# Patient Record
Sex: Male | Born: 1990 | Race: Black or African American | Hispanic: No | Marital: Single | Smoking: Never smoker
Health system: Southern US, Community
[De-identification: ages and names within clinical notes are randomized; demographics above are authoritative.]

---
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
## 2011-11-13 ENCOUNTER — Encounter: Payer: Self-pay | Admitting: *Deleted

## 2011-11-21 ENCOUNTER — Ambulatory Visit: Payer: BLUE CROSS/BLUE SHIELD | Admitting: Cardiology

## 2012-12-07 ENCOUNTER — Encounter (HOSPITAL_COMMUNITY): Payer: Self-pay | Admitting: Emergency Medicine

## 2012-12-07 ENCOUNTER — Emergency Department (HOSPITAL_COMMUNITY)
Admission: EM | Admit: 2012-12-07 | Discharge: 2012-12-07 | Disposition: A | Discharge status: Home / Self Care | Payer: BC Managed Care – PPO | Attending: Emergency Medicine | Admitting: Emergency Medicine

## 2012-12-07 DIAGNOSIS — R1310 Dysphagia, unspecified: Secondary | ICD-10-CM | POA: Insufficient documentation

## 2012-12-07 DIAGNOSIS — R05 Cough: Secondary | ICD-10-CM | POA: Insufficient documentation

## 2012-12-07 DIAGNOSIS — R599 Enlarged lymph nodes, unspecified: Secondary | ICD-10-CM | POA: Insufficient documentation

## 2012-12-07 DIAGNOSIS — J029 Acute pharyngitis, unspecified: Secondary | ICD-10-CM

## 2012-12-07 DIAGNOSIS — R5381 Other malaise: Secondary | ICD-10-CM | POA: Insufficient documentation

## 2012-12-07 DIAGNOSIS — Z880 Allergy status to penicillin: Secondary | ICD-10-CM | POA: Insufficient documentation

## 2012-12-07 DIAGNOSIS — R059 Cough, unspecified: Secondary | ICD-10-CM | POA: Insufficient documentation

## 2012-12-07 MED ORDER — LIDOCAINE VISCOUS 2 % MT SOLN
15.0000 mL | Freq: Once | OROMUCOSAL | Status: AC
  Administered 2012-12-07: 15 mL via OROMUCOSAL
  Filled 2012-12-07: qty 15

## 2012-12-07 MED ORDER — DEXAMETHASONE SODIUM PHOSPHATE 10 MG/ML IJ SOLN
10.0000 mg | Freq: Once | INTRAMUSCULAR | Status: AC
  Administered 2012-12-07: 10 mg via INTRAMUSCULAR
  Filled 2012-12-07: qty 1

## 2012-12-07 MED ORDER — MAGIC MOUTHWASH W/LIDOCAINE: 5.0000 mL | Freq: Three times a day (TID) | ORAL | Status: DC | PRN

## 2012-12-07 MED ORDER — IBUPROFEN 400 MG PO TABS
600.0000 mg | ORAL_TABLET | Freq: Once | ORAL | Status: AC
  Administered 2012-12-07: 600 mg via ORAL
  Filled 2012-12-07 (×2): qty 1

## 2012-12-07 MED ORDER — IBUPROFEN 600 MG PO TABS: 600.0000 mg | ORAL_TABLET | Freq: Four times a day (QID) | ORAL | Status: DC | PRN

## 2012-12-07 NOTE — ED Provider Notes (Signed)
CSN: [id number]     Arrival date & time 12/07/12  0242 History   First MD Initiated Contact with Patient 12/07/12 0533     Chief Complaint  Patient presents with  . Sore Throat   (Consider location/radiation/quality/duration/timing/severity/associated sxs/prior Treatment) HPI Comments: Cough x 3 days, with sore throat. No fevers. + dysphagia, and reduced po intake. No malaise.  Patient is a 22 y.o. male presenting with pharyngitis. The history is provided by the patient.  Sore Throat This is a new problem. The current episode started more than 2 days ago. The problem occurs constantly. The problem has not changed since onset.Pertinent negatives include no chest pain. The symptoms are aggravated by drinking, eating and swallowing. Nothing relieves the symptoms. He has tried nothing for the symptoms.    History reviewed. No pertinent past medical history. No past surgical history on file. No family history on file. History  Substance Use Topics  . Smoking status: Never Smoker   . Smokeless tobacco: Not on file  . Alcohol Use: No    Review of Systems  Constitutional: Positive for activity change and fatigue. Negative for fever and chills.  HENT: Positive for trouble swallowing. Negative for drooling.   Respiratory: Positive for cough.   Cardiovascular: Negative for chest pain.  Gastrointestinal: Negative for nausea.  Skin: Negative for rash.    Allergies  Penicillins  Home Medications  No current outpatient prescriptions on file. BP 143/86  Pulse 101  Temp(Src) 98.1 F (36.7 C) (Oral)  Resp 18  SpO2 97% Physical Exam  Nursing note and vitals reviewed. Constitutional: He is oriented to person, place, and time. He appears well-developed.  HENT:  Head: Normocephalic and atraumatic.  Eyes: Conjunctivae and EOM are normal. Pupils are equal, round, and reactive to light.  Neck: Normal range of motion. Neck supple. No thyromegaly present.  No exudates, no trismus, no PTA   Cardiovascular: Normal rate and regular rhythm.   Pulmonary/Chest: Effort normal and breath sounds normal.  Abdominal: Soft. Bowel sounds are normal. He exhibits no distension. There is no tenderness. There is no rebound and no guarding.  Lymphadenopathy:    He has cervical adenopathy.  Neurological: He is alert and oriented to person, place, and time.  Skin: Skin is warm.    ED Course  Procedures (including critical care time) Labs Review Labs Reviewed  RAPID STREP SCREEN  CULTURE, GROUP A STREP   Imaging Review No results found.  EKG Interpretation   None       MDM  No diagnosis found.  Pt with pharyngitis. + cough, no fevers, no exudates. Rapid strep is neg. No concerns for deep infection. Pt has reduced po intake, will give decadron, and ibuprofen.  Derwood Kaplan, MD 12/07/12 [phone]

## 2012-12-07 NOTE — ED Notes (Signed)
Pt c/o sore throat. Denies fever. Painful to swallow.

## 2012-12-09 LAB — CULTURE, GROUP A STREP

## 2015-05-27 ENCOUNTER — Emergency Department (HOSPITAL_COMMUNITY)
Admission: EM | Admit: 2015-05-27 | Discharge: 2015-05-28 | Disposition: A | Discharge status: Home / Self Care | Payer: BLUE CROSS/BLUE SHIELD | Attending: Emergency Medicine | Admitting: Emergency Medicine

## 2015-05-27 ENCOUNTER — Encounter (HOSPITAL_COMMUNITY): Payer: Self-pay | Admitting: Emergency Medicine

## 2015-05-27 DIAGNOSIS — R42 Dizziness and giddiness: Secondary | ICD-10-CM | POA: Insufficient documentation

## 2015-05-27 DIAGNOSIS — R1084 Generalized abdominal pain: Secondary | ICD-10-CM | POA: Diagnosis not present

## 2015-05-27 DIAGNOSIS — R112 Nausea with vomiting, unspecified: Secondary | ICD-10-CM | POA: Diagnosis not present

## 2015-05-27 DIAGNOSIS — Z880 Allergy status to penicillin: Secondary | ICD-10-CM | POA: Diagnosis not present

## 2015-05-27 DIAGNOSIS — R109 Unspecified abdominal pain: Secondary | ICD-10-CM

## 2015-05-27 DIAGNOSIS — R197 Diarrhea, unspecified: Secondary | ICD-10-CM | POA: Insufficient documentation

## 2015-05-27 LAB — COMPREHENSIVE METABOLIC PANEL
ALBUMIN: 4.3 g/dL (ref 3.5–5.0)
ALK PHOS: 64 U/L (ref 38–126)
ALT: 27 U/L (ref 17–63)
AST: 26 U/L (ref 15–41)
Anion gap: 10 (ref 5–15)
BILIRUBIN TOTAL: 0.8 mg/dL (ref 0.3–1.2)
BUN: 16 mg/dL (ref 6–20)
CALCIUM: 9.2 mg/dL (ref 8.9–10.3)
CO2: 24 mmol/L (ref 22–32)
CREATININE: 1.19 mg/dL (ref 0.61–1.24)
Chloride: 104 mmol/L (ref 101–111)
GFR calc Af Amer: >60 mL/min (ref >60)
GLUCOSE: 116 mg/dL — AB (ref 65–99)
POTASSIUM: 4.3 mmol/L (ref 3.5–5.1)
Sodium: 138 mmol/L (ref 135–145)
TOTAL PROTEIN: 7 g/dL (ref 6.5–8.1)

## 2015-05-27 LAB — URINALYSIS, ROUTINE W REFLEX MICROSCOPIC
BILIRUBIN URINE: NEGATIVE
Glucose, UA: NEGATIVE mg/dL
Hgb urine dipstick: NEGATIVE
KETONES UR: NEGATIVE mg/dL
LEUKOCYTES UA: NEGATIVE
NITRITE: NEGATIVE
PH: 6 (ref 5.0–8.0)
PROTEIN: NEGATIVE mg/dL
Specific Gravity, Urine: 1.028 (ref 1.005–1.030)

## 2015-05-27 LAB — CBC
HCT: 49.9 % (ref 39.0–52.0)
Hemoglobin: 16.5 g/dL (ref 13.0–17.0)
MCH: 28.4 pg (ref 26.0–34.0)
MCHC: 33.1 g/dL (ref 30.0–36.0)
MCV: 86 fL (ref 78.0–100.0)
PLATELETS: 241 10*3/uL (ref 150–400)
RBC: 5.8 MIL/uL (ref 4.22–5.81)
RDW: 13.2 % (ref 11.5–15.5)
WBC: 9.9 10*3/uL (ref 4.0–10.5)

## 2015-05-27 LAB — LIPASE, BLOOD: Lipase: 49 U/L (ref 11–51)

## 2015-05-27 NOTE — ED Provider Notes (Signed)
CSN: [id number]     Arrival date & time 05/27/15  2104 History  By signing my name below, I, Arianna Nassar, attest that this documentation has been prepared under the direction and in the presence of Azalia Bilis, MD. Electronically Signed: Octavia Heir, ED Scribe. 05/28/2015. 2:20 AM.    Chief Complaint  Patient presents with  . Abdominal Pain      The history is provided by the patient. No language interpreter was used.   HPI Comments: Carlos Flynn is a 25 y.o. male who presents to the Emergency Department complaining of constant, gradual worsening, moderate, generalized, abdominal pain onset a couple of days ago with associated dizziness, nausea, vomiting, and diarrhea that started today. Denies urinary symptoms, fever, or chills.  History reviewed. No pertinent past medical history. History reviewed. No pertinent past surgical history. No family history on file. Social History  Substance Use Topics  . Smoking status: Never Smoker   . Smokeless tobacco: None  . Alcohol Use: No    Review of Systems  A complete 10 system review of systems was obtained and all systems are negative except as noted in the HPI and PMH.    Allergies  Penicillins  Home Medications   Prior to Admission medications   Medication Sig Start Date End Date Taking? Authorizing Provider  Alum & Mag Hydroxide-Simeth (MAGIC MOUTHWASH W/LIDOCAINE) SOLN Take 5 mLs by mouth 3 (three) times daily as needed. 12/07/12   Derwood Kaplan, MD  ibuprofen (ADVIL,MOTRIN) 600 MG tablet Take 1 tablet (600 mg total) by mouth every 6 (six) hours as needed for pain. 12/07/12   Derwood Kaplan, MD   Triage vitals: BP 139/87 mmHg  Pulse 107  Temp(Src) 100.9 F (38.3 C) (Oral)  Resp 20  Ht  (1.803 m)  Wt 230 lb (104.327 kg)  BMI 32.09 kg/m2  SpO2 100% Physical Exam  Constitutional: He is oriented to person, place, and time. He appears well-developed and well-nourished.  HENT:  Head: Normocephalic.  Eyes: EOM are  normal.  Neck: Normal range of motion.  Pulmonary/Chest: Effort normal.  Abdominal: He exhibits no distension.  Mild generalized abdominal tenderness  Musculoskeletal: Normal range of motion.  Neurological: He is alert and oriented to person, place, and time.  Psychiatric: He has a normal mood and affect.  Nursing note and vitals reviewed.   ED Course  Procedures  DIAGNOSTIC STUDIES: Oxygen Saturation is 100% on RA, normal by my interpretation.  COORDINATION OF CARE:  2:12 AM Will order CT of abdomen. Discussed treatment plan with pt at bedside and pt agreed to plan.  Labs Review Labs Reviewed  COMPREHENSIVE METABOLIC PANEL - Abnormal; Notable for the following:    Glucose, Bld 116 (*)    All other components within normal limits  LIPASE, BLOOD  CBC  URINALYSIS, ROUTINE W REFLEX MICROSCOPIC (NOT AT [facility])    Imaging Review Ct Abdomen Pelvis W Contrast  05/28/2015  CLINICAL DATA:  Acute onset of generalized abdominal pain, fever, nausea, vomiting and diarrhea. Initial encounter. EXAM: CT ABDOMEN AND PELVIS WITH CONTRAST TECHNIQUE: Multidetector CT imaging of the abdomen and pelvis was performed using the standard protocol following bolus administration of intravenous contrast. CONTRAST:  OMNIPAQUE IOHEXOL 300 MG/ML  SOLN COMPARISON:  None. FINDINGS: The visualized lung bases are clear. The liver and spleen are unremarkable in appearance. The gallbladder is within normal limits. The pancreas and adrenal glands are unremarkable. The kidneys are unremarkable in appearance. There is no evidence of hydronephrosis. No renal  or ureteral stones are seen. No perinephric stranding is appreciated. No free fluid is identified. The small bowel is unremarkable in appearance. The stomach is within normal limits. No acute vascular abnormalities are seen. The appendix is normal in caliber, without evidence of appendicitis. The colon is unremarkable in appearance. The bladder is mildly distended and  grossly unremarkable. The prostate remains normal in size. No inguinal lymphadenopathy is seen. No acute osseous abnormalities are identified. IMPRESSION: No acute abnormality seen within the abdomen or pelvis. Electronically Signed   By: Roanna RaiderJeffery  Chang M.D.   On: 05/28/2015 03:51   I have personally reviewed and evaluated these images and lab results as part of my medical decision-making.   EKG Interpretation None      MDM   Final diagnoses:  Nausea vomiting and diarrhea  Abdominal pain, unspecified abdominal location    4:22 AM Patient feels much better this time.  Discharge home in good condition.  Likely viral gastritis.  CT scan without acute pathology.  Home with Zofran.  He understands to return to the ER for new or worsening symptoms   I personally performed the services described in this documentation, which was scribed in my presence. The recorded information has been reviewed and is accurate.      Azalia BilisKevin Alixandra Alfieri, MD 05/28/15 [phone]

## 2015-05-27 NOTE — ED Notes (Signed)
Pt. reports mid abdominal pain with emesis and diarrhea onset this week with mild headache.

## 2015-05-28 ENCOUNTER — Emergency Department (HOSPITAL_COMMUNITY): Payer: BLUE CROSS/BLUE SHIELD

## 2015-05-28 MED ORDER — ONDANSETRON HCL 4 MG/2ML IJ SOLN
4.0000 mg | Freq: Once | INTRAMUSCULAR | Status: AC
  Administered 2015-05-28: 4 mg via INTRAVENOUS
  Filled 2015-05-28: qty 2

## 2015-05-28 MED ORDER — IOHEXOL 300 MG/ML  SOLN
100.0000 mL | Freq: Once | INTRAMUSCULAR | Status: AC | PRN
  Administered 2015-05-28: 100 mL via INTRAVENOUS

## 2015-05-28 MED ORDER — ONDANSETRON 8 MG PO TBDP: 8.0000 mg | ORAL_TABLET | Freq: Three times a day (TID) | ORAL | Status: AC | PRN

## 2015-05-28 MED ORDER — SODIUM CHLORIDE 0.9 % IV BOLUS (SEPSIS)
1000.0000 mL | Freq: Once | INTRAVENOUS | Status: AC
  Administered 2015-05-28: 1000 mL via INTRAVENOUS

## 2015-05-28 MED ORDER — MORPHINE SULFATE (PF) 4 MG/ML IV SOLN
6.0000 mg | Freq: Once | INTRAVENOUS | Status: AC
  Administered 2015-05-28: 6 mg via INTRAVENOUS
  Filled 2015-05-28: qty 2

## 2015-10-04 ENCOUNTER — Emergency Department (HOSPITAL_COMMUNITY)
Admission: EM | Admit: 2015-10-04 | Discharge: 2015-10-04 | Disposition: A | Discharge status: Home / Self Care | Payer: BLUE CROSS/BLUE SHIELD | Attending: Emergency Medicine | Admitting: Emergency Medicine

## 2015-10-04 ENCOUNTER — Encounter (HOSPITAL_COMMUNITY): Payer: Self-pay | Admitting: Emergency Medicine

## 2015-10-04 ENCOUNTER — Emergency Department (HOSPITAL_COMMUNITY): Payer: BLUE CROSS/BLUE SHIELD

## 2015-10-04 DIAGNOSIS — Z79899 Other long term (current) drug therapy: Secondary | ICD-10-CM | POA: Diagnosis not present

## 2015-10-04 DIAGNOSIS — S99912A Unspecified injury of left ankle, initial encounter: Secondary | ICD-10-CM | POA: Diagnosis present

## 2015-10-04 DIAGNOSIS — S93402A Sprain of unspecified ligament of left ankle, initial encounter: Secondary | ICD-10-CM | POA: Insufficient documentation

## 2015-10-04 DIAGNOSIS — Y999 Unspecified external cause status: Secondary | ICD-10-CM | POA: Diagnosis not present

## 2015-10-04 DIAGNOSIS — X501XXA Overexertion from prolonged static or awkward postures, initial encounter: Secondary | ICD-10-CM | POA: Insufficient documentation

## 2015-10-04 DIAGNOSIS — Y929 Unspecified place or not applicable: Secondary | ICD-10-CM | POA: Diagnosis not present

## 2015-10-04 DIAGNOSIS — Y9351 Activity, roller skating (inline) and skateboarding: Secondary | ICD-10-CM | POA: Diagnosis not present

## 2015-10-04 MED ORDER — HYDROCODONE-ACETAMINOPHEN 5-325 MG PO TABS: 1.0000 | ORAL_TABLET | Freq: Four times a day (QID) | ORAL | 0 refills | Status: AC | PRN

## 2015-10-04 MED ORDER — IBUPROFEN 800 MG PO TABS
800.0000 mg | ORAL_TABLET | Freq: Once | ORAL | Status: AC
  Administered 2015-10-04: 800 mg via ORAL
  Filled 2015-10-04: qty 1

## 2015-10-04 MED ORDER — IBUPROFEN 800 MG PO TABS
800.0000 mg | ORAL_TABLET | Freq: Three times a day (TID) | ORAL | 0 refills | Status: AC | PRN
Start: 2015-10-04 — End: ?

## 2015-10-04 NOTE — ED Provider Notes (Signed)
[facility]-EMERGENCY DEPT Provider Note   CSN: [id number] Arrival date & time: 10/04/15  1955  First Provider Contact:   First MD Initiated Contact with Patient 10/04/15 2055     By signing my name below, I, Carlos Flynn, attest that this documentation has been prepared under the direction and in the presence of Buel Ream, PA-C.  Electronically Signed: Placido Flynn, ED Scribe. 10/04/15. 9:00 PM.   History   Chief Complaint Chief Complaint  Patient presents with  . Ankle Pain    HPI HPI Comments: Carlos Flynn is a 25 y.o. male who presents to the Emergency Department complaining of sudden onset, moderate, constant, left ankle pain onset PTA. Pt states he was roller skating and accidentally everted his left ankle resulting in his pain. He states that he took "a pain pill" from a friend and is unsure of what he took. His pain worsens with palpation of the ankle and denies having ambulated since the accident. He reports associated, mild, left ankle swelling. He denies numbness, tingling or any other associated symptoms at this time.   The history is provided by the patient. No language interpreter was used.   History reviewed. No pertinent past medical history.  There are no active problems to display for this patient.   History reviewed. No pertinent surgical history.   Home Medications    Prior to Admission medications   Medication Sig Start Date End Date Taking? Authorizing Provider  HYDROcodone-acetaminophen (NORCO/VICODIN) 5-325 MG tablet Take 1-2 tablets by mouth every 6 (six) hours as needed. 10/04/15   Emi Holes, PA-C  ibuprofen (ADVIL,MOTRIN) 800 MG tablet Take 1 tablet (800 mg total) by mouth every 8 (eight) hours as needed. 10/04/15   Waylan Boga Lew Prout, PA-C  ondansetron (ZOFRAN ODT) 8 MG disintegrating tablet Take 1 tablet (8 mg total) by mouth every 8 (eight) hours as needed for nausea or vomiting. 05/28/15   Azalia Bilis, MD    Family History No family history on  file.  Social History Social History  Substance Use Topics  . Smoking status: Never Smoker  . Smokeless tobacco: Never Used  . Alcohol use Yes     Allergies   Penicillins and Sulfa antibiotics   Review of Systems Review of Systems  Constitutional: Negative for chills and fever.  HENT: Negative for facial swelling and sore throat.   Respiratory: Negative for shortness of breath.   Cardiovascular: Negative for chest pain.  Gastrointestinal: Negative for abdominal pain, nausea and vomiting.  Genitourinary: Negative for dysuria.  Musculoskeletal: Positive for arthralgias and joint swelling.  Skin: Negative for color change, rash and wound.  Psychiatric/Behavioral: The patient is not nervous/anxious.    Physical Exam Updated Vital Signs BP 119/65 (BP Location: Left Arm)   Pulse 104   Temp 98.3 F (36.8 C) (Oral)   Resp 21   SpO2 100%   Physical Exam  Constitutional: He appears well-developed and well-nourished. No distress.  HENT:  Head: Normocephalic and atraumatic.  Eyes: Conjunctivae are normal. Pupils are equal, round, and reactive to light. Right eye exhibits no discharge. Left eye exhibits no discharge. No scleral icterus.  Neck: Normal range of motion. Neck supple. No thyromegaly present.  Cardiovascular: Normal rate, regular rhythm and normal heart sounds.  Exam reveals no gallop and no friction rub.   No murmur heard. Pulmonary/Chest: Effort normal and breath sounds normal. No stridor. No respiratory distress. He has no wheezes. He has no rales.  Abdominal: Soft. Bowel sounds are normal. He exhibits  no distension. There is no tenderness. There is no rebound and no guarding.  Musculoskeletal: He exhibits tenderness. He exhibits no edema.       Left ankle: He exhibits decreased range of motion and swelling. He exhibits no laceration and normal pulse. Tenderness. Medial malleolus tenderness found. No head of 5th metatarsal and no proximal fibula tenderness found.        Right foot: There is no tenderness and no bony tenderness.       Feet:  TTP to medial aspect of the left ankle; 5/5 strength with hip flexion and extension; patient unable to plantar and dorsiflex without pain; able to move all toes freely  Lymphadenopathy:    He has no cervical adenopathy.  Neurological: He is alert. No sensory deficit. Coordination normal.  Nml sensation  Skin: Skin is warm and dry. No rash noted. He is not diaphoretic. No pallor.  Psychiatric: He has a normal mood and affect.  Nursing note and vitals reviewed.  ED Treatments / Results  Labs (all labs ordered are listed, but only abnormal results are displayed) Labs Reviewed - No data to display  EKG  EKG Interpretation None       Radiology Dg Ankle Complete Left  Result Date: 10/04/2015 CLINICAL DATA:  Left ankle pain and swelling after twisting injury while roller-skating 20 minutes prior. EXAM: LEFT ANKLE COMPLETE - 3+ VIEW COMPARISON:  None. FINDINGS: No acute fracture. There is widening at the medial clear space and 7 mm. Medial soft tissue edema. Well corticated density arising from the posterior tibial tubercle may be sequela of prior injury. Questionable small tibial talar joint effusion. IMPRESSION: Widening of the medial clear space with medial soft tissue edema suggesting ligamentous injury. No acute fracture. Electronically Signed   By: Rubye Oaks M.D.   On: 10/04/2015 20:44    Procedures Procedures  DIAGNOSTIC STUDIES: Oxygen Saturation is 100% on RA, normal by my interpretation.    COORDINATION OF CARE: 8:58 PM Discussed next steps with pt. Pt verbalized understanding and is agreeable with the plan.    Medications Ordered in ED Medications  ibuprofen (ADVIL,MOTRIN) tablet 800 mg (800 mg Oral Given 10/04/15 2127)     Initial Impression / Assessment and Plan / ED Course  I have reviewed the triage vital signs and the nursing notes.  Pertinent labs & imaging results that were available  during my care of the patient were reviewed by me and considered in my medical decision making (see chart for details).  Clinical Course   Ibuprofen and ice given in ED.  Patient X-Ray negative for obvious fracture or dislocation.  Pt advised to follow up with orthopedics. Patient given ASO and crutches while in ED, conservative therapy recommended and discussed. Patient advised to begin weightbearing as tolerated. Patient discharged with short course of Norco and ibuprofen for pain. Patient will be discharged home & is agreeable with above plan. Returns precautions discussed. Pt appears safe for discharge.  I personally performed the services described in this documentation, which was scribed in my presence. The recorded information has been reviewed and is accurate.   Final Clinical Impressions(s) / ED Diagnoses   Final diagnoses:  Ankle sprain, left, initial encounter    New Prescriptions Discharge Medication List as of 10/04/2015  9:57 PM    START taking these medications   Details  HYDROcodone-acetaminophen (NORCO/VICODIN) 5-325 MG tablet Take 1-2 tablets by mouth every 6 (six) hours as needed., Starting Tue 10/04/2015, Print    ibuprofen (ADVIL,MOTRIN)  800 MG tablet Take 1 tablet (800 mg total) by mouth every 8 (eight) hours as needed., Starting Tue 10/04/2015, Print         Emi HolesAlexandra M Dorine Duffey, PA-C 10/04/15 2312    Emi HolesAlexandra M Chrystian Ressler, PA-C 10/04/15 2312    Shaune Pollackameron Isaacs, MD 10/05/15 1231

## 2015-10-04 NOTE — Discharge Instructions (Signed)
Medications: Norco, ibuprofen  Treatment: Take 1-2 Norco every 6 hours as needed for severe pain. Take ibuprofen every 8 hours as needed for mild to moderate pain. Ice your ankle 3-4 times daily alternating 20 minutes on, 20 minutes off. Wear your splint at all times when ambulating except when bathing. Use crutches and begin to bear weight as tolerated. Keep your ankle elevated whenever you are sitting or laying down.  Follow-up: Please follow-up with orthopedics as needed as indicated on your discharge paperwork. Please return to the emergency department if you develop any new or worsening symptoms.

## 2015-10-04 NOTE — ED Triage Notes (Signed)
Pt presents with swelling and pain noted to L ankle, pt states he twisted ankle while roller skating 20 minutes ago.

## 2017-02-10 IMAGING — CT CT ABD-PELV W/ CM
2 of 4 series · 11 of 46 positions shown, 12 images · IV contrast (Iodine)
Comparison: None.

CLINICAL DATA: Acute onset of generalized abdominal pain, fever,
nausea, vomiting and diarrhea. Initial encounter.

EXAM:
CT ABDOMEN AND PELVIS WITH CONTRAST
TECHNIQUE: Multidetector CT imaging of the abdomen and pelvis was performed
using the standard protocol following bolus administration of
intravenous contrast.
CONTRAST:  100mL OMNIPAQUE IOHEXOL 300 MG/ML  SOLN

[Series 201: routine, idose (2) · axial · 0.66mm/px · z∈[+60,+480]mm · 8 of 101 slices shown, 9 images]
[im 9/101  soft-tissue]
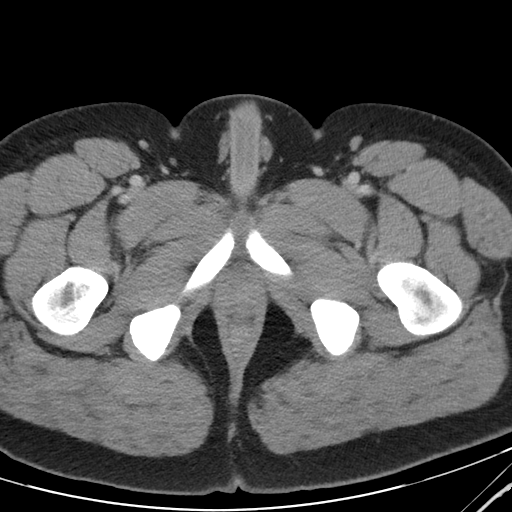
[im 9/101  bone]
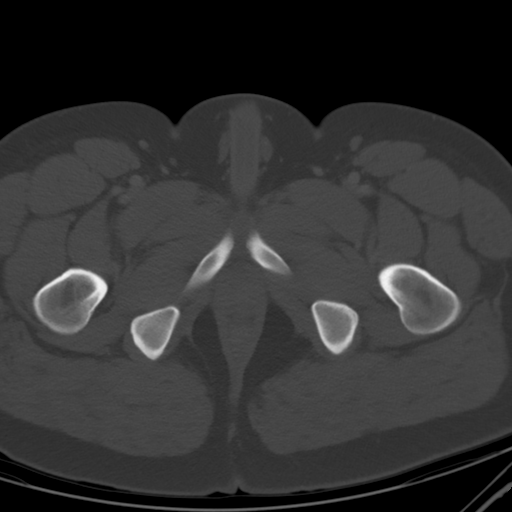
[im 21/101  soft-tissue]
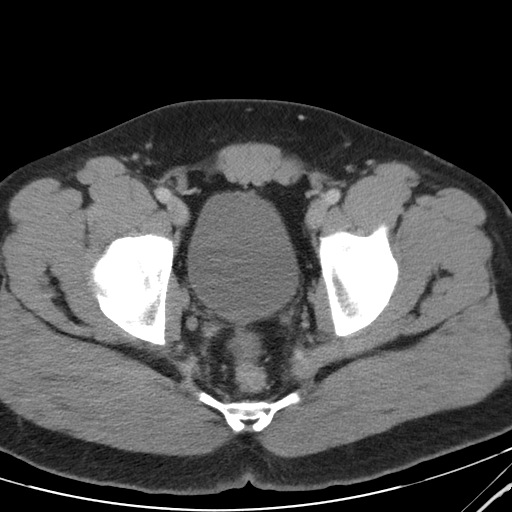
[im 33/101  soft-tissue]
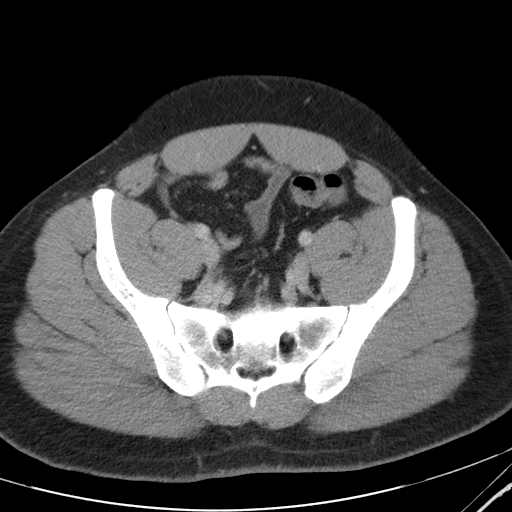
[im 45/101  soft-tissue]
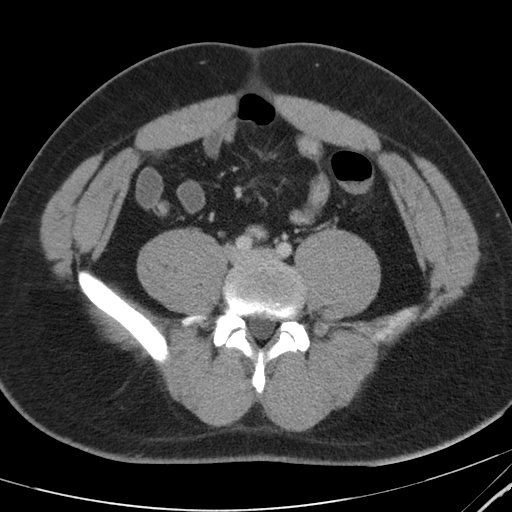
[im 57/101  soft-tissue]
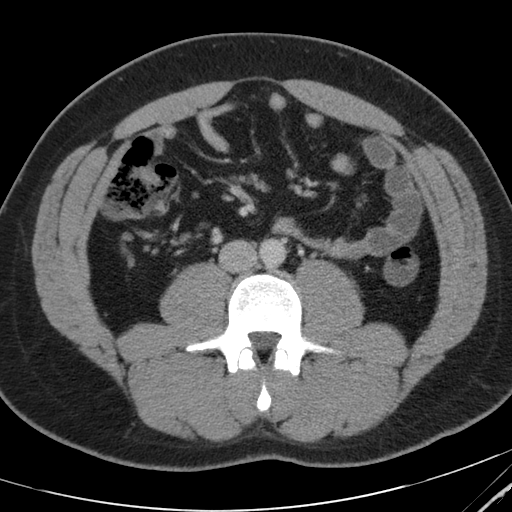
[im 69/101  soft-tissue]
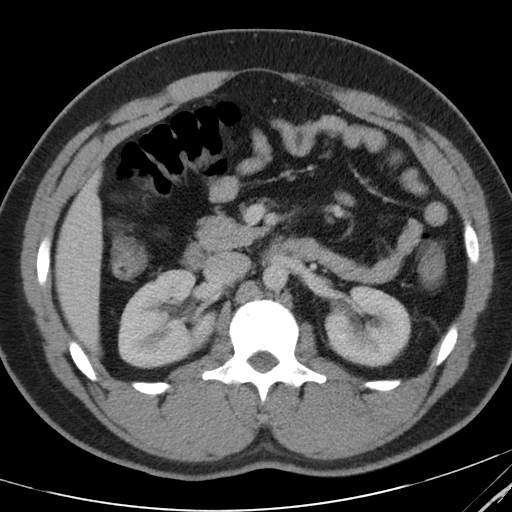
[im 81/101  soft-tissue]
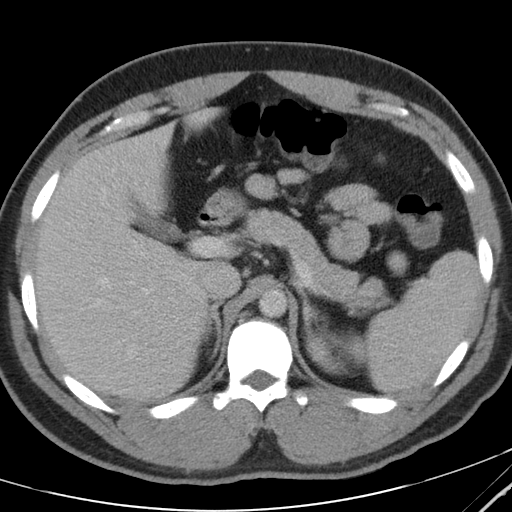
[im 93/101  soft-tissue]
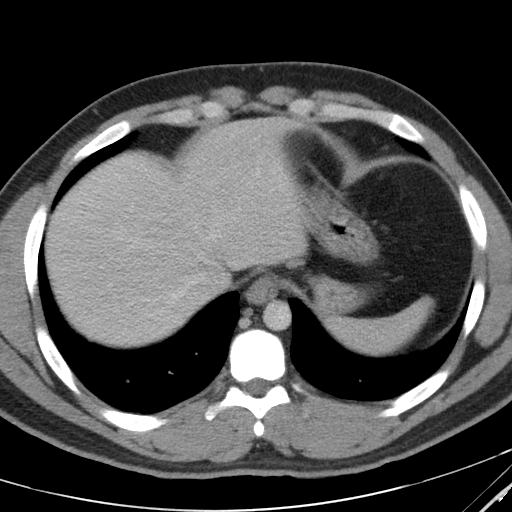

[Series 203: coronals, idose (2) · coronal · 0.45mm/px · 3 of 132 slices shown]
[im 44/132  soft-tissue]
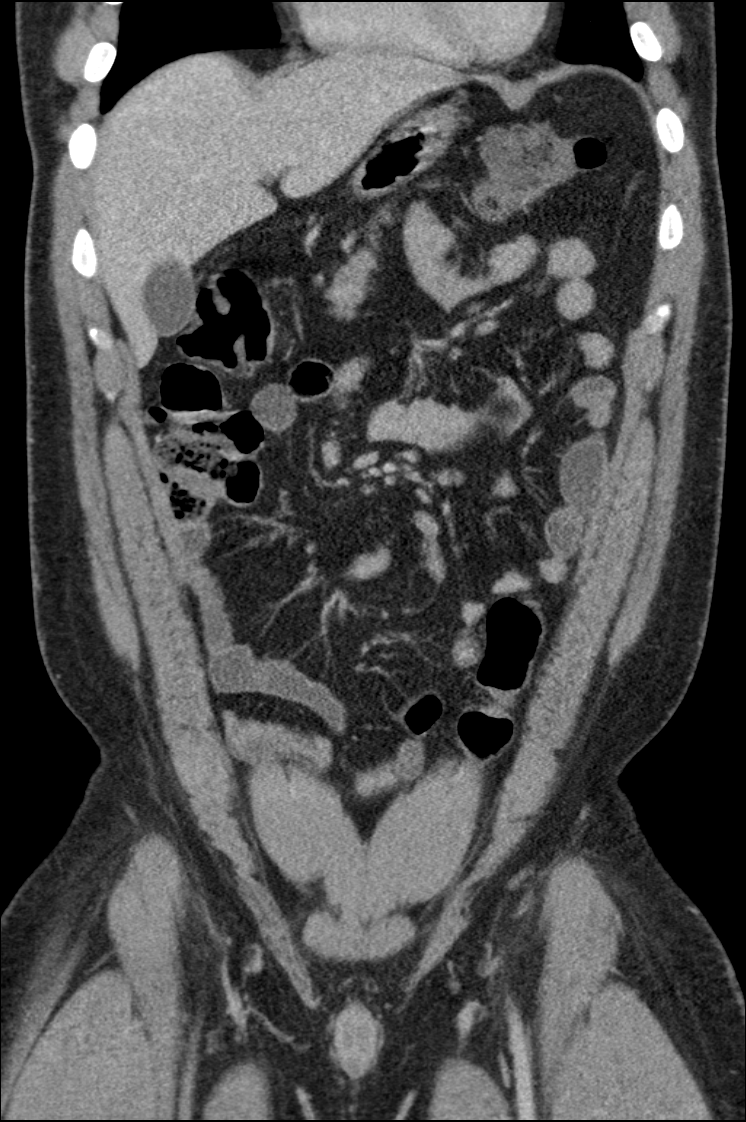
[im 59/132  soft-tissue]
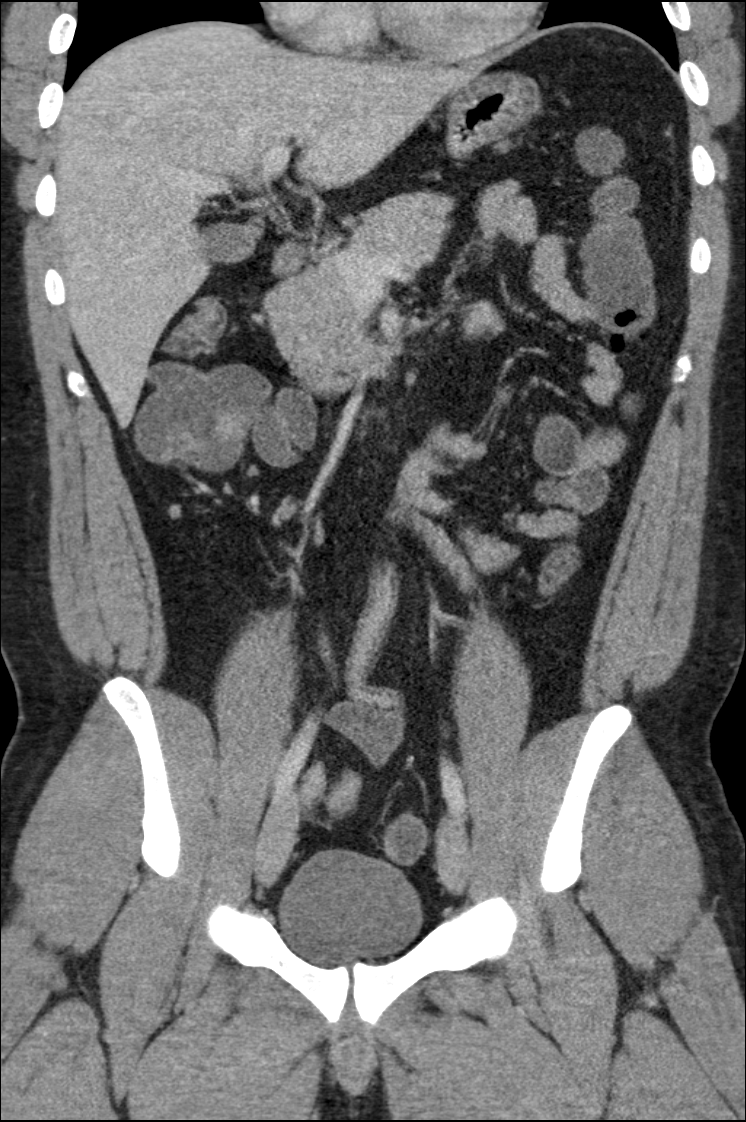
[im 73/132  soft-tissue]
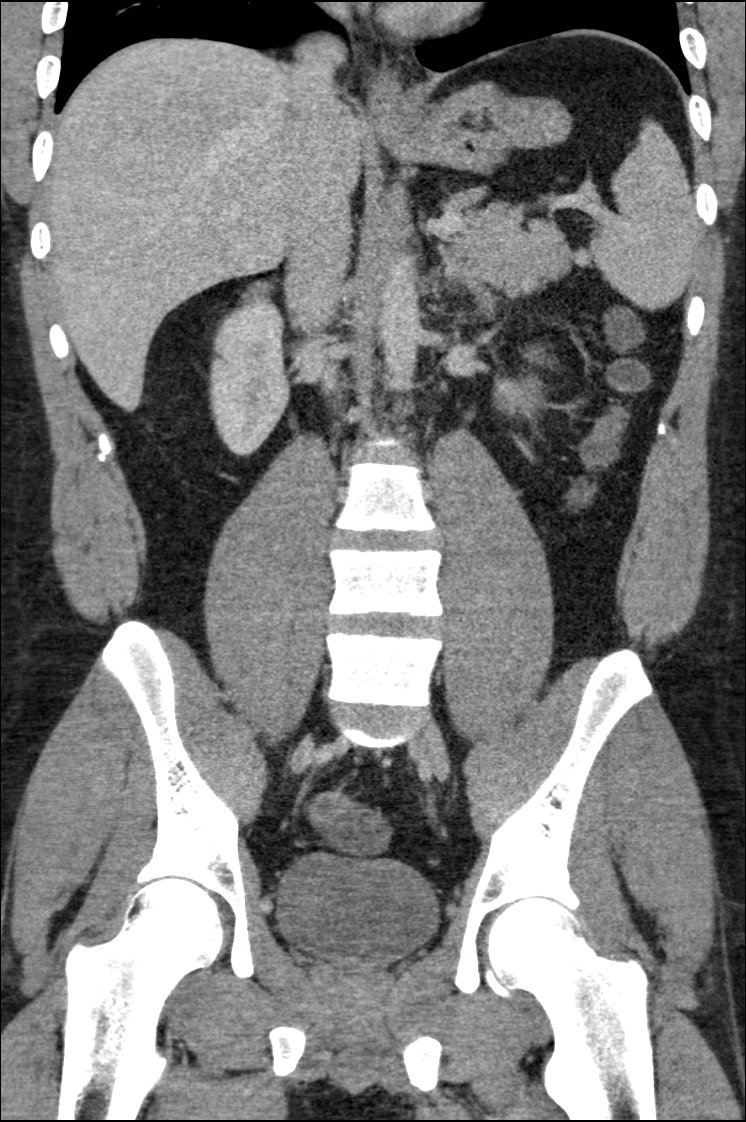

[11 of 46 positions shown; findings below may reference images not displayed]

FINDINGS: The visualized lung bases are clear.

The liver and spleen are unremarkable in appearance. The gallbladder
is within normal limits. The pancreas and adrenal glands are
unremarkable.

The kidneys are unremarkable in appearance. There is no evidence of
hydronephrosis. No renal or ureteral stones are seen. No perinephric
stranding is appreciated.

No free fluid is identified. The small bowel is unremarkable in
appearance. The stomach is within normal limits. No acute vascular
abnormalities are seen.

The appendix is normal in caliber, without evidence of appendicitis.
The colon is unremarkable in appearance.

The bladder is mildly distended and grossly unremarkable. The
prostate remains normal in size. No inguinal lymphadenopathy is
seen.

No acute osseous abnormalities are identified.
IMPRESSION: No acute abnormality seen within the abdomen or pelvis.
